# Patient Record
Sex: Male | Born: 1995 | Race: Black or African American | Hispanic: No | Marital: Single | Smoking: Never smoker
Health system: Southern US, Community
[De-identification: ages and names within clinical notes are randomized; demographics above are authoritative.]

## PROBLEM LIST (undated history)

## (undated) DIAGNOSIS — G4730 Sleep apnea, unspecified: Secondary | ICD-10-CM

---
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
## 2017-12-06 ENCOUNTER — Emergency Department (HOSPITAL_BASED_OUTPATIENT_CLINIC_OR_DEPARTMENT_OTHER): Payer: 59

## 2017-12-06 ENCOUNTER — Encounter (HOSPITAL_COMMUNITY): Payer: Self-pay | Admitting: Emergency Medicine

## 2017-12-06 ENCOUNTER — Observation Stay (HOSPITAL_COMMUNITY)
Admission: EM | Admit: 2017-12-06 | Discharge: 2017-12-07 | Disposition: A | Discharge status: Home / Self Care | Payer: 59 | Attending: Student in an Organized Health Care Education/Training Program | Admitting: Student in an Organized Health Care Education/Training Program

## 2017-12-06 ENCOUNTER — Emergency Department (HOSPITAL_COMMUNITY): Payer: 59

## 2017-12-06 ENCOUNTER — Other Ambulatory Visit: Payer: Self-pay

## 2017-12-06 DIAGNOSIS — E669 Obesity, unspecified: Secondary | ICD-10-CM | POA: Diagnosis present

## 2017-12-06 DIAGNOSIS — L03116 Cellulitis of left lower limb: Principal | ICD-10-CM | POA: Insufficient documentation

## 2017-12-06 DIAGNOSIS — Z6839 Body mass index (BMI) 39.0-39.9, adult: Secondary | ICD-10-CM | POA: Diagnosis not present

## 2017-12-06 DIAGNOSIS — M7989 Other specified soft tissue disorders: Secondary | ICD-10-CM

## 2017-12-06 DIAGNOSIS — D72829 Elevated white blood cell count, unspecified: Secondary | ICD-10-CM

## 2017-12-06 DIAGNOSIS — L02426 Furuncle of left lower limb: Secondary | ICD-10-CM

## 2017-12-06 DIAGNOSIS — G4733 Obstructive sleep apnea (adult) (pediatric): Secondary | ICD-10-CM | POA: Diagnosis not present

## 2017-12-06 DIAGNOSIS — Z9989 Dependence on other enabling machines and devices: Secondary | ICD-10-CM

## 2017-12-06 DIAGNOSIS — R509 Fever, unspecified: Secondary | ICD-10-CM

## 2017-12-06 DIAGNOSIS — M79609 Pain in unspecified limb: Secondary | ICD-10-CM

## 2017-12-06 DIAGNOSIS — B351 Tinea unguium: Secondary | ICD-10-CM | POA: Diagnosis not present

## 2017-12-06 HISTORY — DX: Sleep apnea, unspecified: G47.30

## 2017-12-06 LAB — CBC WITH DIFFERENTIAL/PLATELET
BASOS ABS: 0 10*3/uL (ref 0.0–0.1)
Basophils Relative: 0 %
EOS ABS: 0 10*3/uL (ref 0.0–0.7)
Eosinophils Relative: 0 %
HEMATOCRIT: 44.7 % (ref 39.0–52.0)
Hemoglobin: 14.9 g/dL (ref 13.0–17.0)
LYMPHS ABS: 1.1 10*3/uL (ref 0.7–4.0)
Lymphocytes Relative: 4 %
MCH: 29.6 pg (ref 26.0–34.0)
MCHC: 33.3 g/dL (ref 30.0–36.0)
MCV: 88.7 fL (ref 78.0–100.0)
Monocytes Absolute: 0.8 10*3/uL (ref 0.1–1.0)
Monocytes Relative: 3 %
NEUTROS ABS: 24.5 10*3/uL — AB (ref 1.7–7.7)
Neutrophils Relative %: 93 %
Platelets: 271 10*3/uL (ref 150–400)
RBC: 5.04 MIL/uL (ref 4.22–5.81)
RDW: 13.2 % (ref 11.5–15.5)
WBC: 26.4 10*3/uL — ABNORMAL HIGH (ref 4.0–10.5)

## 2017-12-06 LAB — URINALYSIS, ROUTINE W REFLEX MICROSCOPIC
BACTERIA UA: NONE SEEN
Bilirubin Urine: NEGATIVE
Glucose, UA: NEGATIVE mg/dL
HGB URINE DIPSTICK: NEGATIVE
Ketones, ur: NEGATIVE mg/dL
Leukocytes, UA: NEGATIVE
Nitrite: NEGATIVE
Protein, ur: 30 mg/dL — AB
SPECIFIC GRAVITY, URINE: 1.028 (ref 1.005–1.030)
pH: 8 (ref 5.0–8.0)

## 2017-12-06 LAB — COMPREHENSIVE METABOLIC PANEL
ALK PHOS: 108 U/L (ref 38–126)
ALT: 32 U/L (ref 0–44)
AST: 25 U/L (ref 15–41)
Albumin: 4.1 g/dL (ref 3.5–5.0)
Anion gap: 12 (ref 5–15)
BILIRUBIN TOTAL: 1.1 mg/dL (ref 0.3–1.2)
BUN: 15 mg/dL (ref 6–20)
CALCIUM: 9.6 mg/dL (ref 8.9–10.3)
CO2: 23 mmol/L (ref 22–32)
Chloride: 101 mmol/L (ref 98–111)
Creatinine, Ser: 1.16 mg/dL (ref 0.61–1.24)
GFR calc Af Amer: >60 mL/min (ref >60)
GFR calc non Af Amer: >60 mL/min (ref >60)
Glucose, Bld: 116 mg/dL — ABNORMAL HIGH (ref 70–99)
Potassium: 3.9 mmol/L (ref 3.5–5.1)
Sodium: 136 mmol/L (ref 135–145)
TOTAL PROTEIN: 8.1 g/dL (ref 6.5–8.1)

## 2017-12-06 LAB — I-STAT CG4 LACTIC ACID, ED
LACTIC ACID, VENOUS: 1.7 mmol/L (ref 0.5–1.9)
Lactic Acid, Venous: 1.91 mmol/L — ABNORMAL HIGH (ref 0.5–1.9)

## 2017-12-06 LAB — C-REACTIVE PROTEIN: CRP: 16.1 mg/dL — ABNORMAL HIGH (ref <1.0)

## 2017-12-06 LAB — SEDIMENTATION RATE: Sed Rate: 47 mm/hr — ABNORMAL HIGH (ref 0–16)

## 2017-12-06 MED ORDER — POLYETHYLENE GLYCOL 3350 17 G PO PACK: 17.0000 g | PACK | Freq: Every day | ORAL | Status: DC | PRN

## 2017-12-06 MED ORDER — ACETAMINOPHEN 650 MG RE SUPP: 650.0000 mg | Freq: Four times a day (QID) | RECTAL | Status: DC | PRN

## 2017-12-06 MED ORDER — PIPERACILLIN-TAZOBACTAM 3.375 G IVPB 30 MIN
3.3750 g | Freq: Once | INTRAVENOUS | Status: AC
  Administered 2017-12-06: 3.375 g via INTRAVENOUS
  Filled 2017-12-06: qty 50

## 2017-12-06 MED ORDER — KETOROLAC TROMETHAMINE 30 MG/ML IJ SOLN: 30.0000 mg | Freq: Four times a day (QID) | INTRAMUSCULAR | Status: DC | PRN

## 2017-12-06 MED ORDER — ACETAMINOPHEN 325 MG PO TABS
650.0000 mg | ORAL_TABLET | Freq: Once | ORAL | Status: AC | PRN
  Administered 2017-12-06: 650 mg via ORAL
  Filled 2017-12-06: qty 2

## 2017-12-06 MED ORDER — PIPERACILLIN-TAZOBACTAM 3.375 G IVPB: 3.3750 g | Freq: Three times a day (TID) | INTRAVENOUS | Status: DC

## 2017-12-06 MED ORDER — TETANUS-DIPHTH-ACELL PERTUSSIS 5-2.5-18.5 LF-MCG/0.5 IM SUSP: 0.5000 mL | Freq: Once | INTRAMUSCULAR | Status: DC

## 2017-12-06 MED ORDER — ACETAMINOPHEN 325 MG PO TABS: 650.0000 mg | ORAL_TABLET | Freq: Four times a day (QID) | ORAL | Status: DC | PRN

## 2017-12-06 MED ORDER — SODIUM CHLORIDE 0.9 % IV BOLUS
1000.0000 mL | Freq: Once | INTRAVENOUS | Status: AC
  Administered 2017-12-06: 1000 mL via INTRAVENOUS

## 2017-12-06 MED ORDER — VANCOMYCIN HCL IN DEXTROSE 1-5 GM/200ML-% IV SOLN
1000.0000 mg | Freq: Once | INTRAVENOUS | Status: AC
  Administered 2017-12-06: 1000 mg via INTRAVENOUS
  Filled 2017-12-06: qty 200

## 2017-12-06 MED ORDER — ENOXAPARIN SODIUM 40 MG/0.4ML ~~LOC~~ SOLN
40.0000 mg | SUBCUTANEOUS | Status: DC
  Administered 2017-12-06: 40 mg via SUBCUTANEOUS
  Filled 2017-12-06: qty 0.4

## 2017-12-06 MED ORDER — SODIUM CHLORIDE 0.9 % IV SOLN
2.0000 g | INTRAVENOUS | Status: DC
  Administered 2017-12-07: 2 g via INTRAVENOUS
  Filled 2017-12-06 (×2): qty 20

## 2017-12-06 NOTE — ED Provider Notes (Signed)
[facility] EMERGENCY DEPARTMENT Provider Note   CSN: [id number] Arrival date & time: 12/06/17  1342     History   Chief Complaint Chief Complaint  Patient presents with  . Leg Pain  . Leg Swelling    HPI Aaron Welch is a 22 y.o. male.  HPI  Patient is a 22 year old male with a history of sleep apnea and obesity presenting for left lower extremity swelling and erythema.  Patient reports that he has had some swelling of the left lower extremity for "months", however today he had rapid progression of the erythema and pain of the left lower extremity.  He denies any puncture wound to the left lower extremity.  Patient denies any recent travel, prolonged immobilization, or recent surgery or hospitalization.  Patient reports he also became febrile this morning, but did not take his temperature at home.  Patient denies any chest pain, shortness of breath, cough, abdominal pain, testicular pain, dysuria, urgency or frequency.  Patient does report he vomited once this morning, but no longer feels nauseous.  Tdap unknown. No history of immune compromised status.   Past Medical History:  Diagnosis Date  . Sleep apnea     There are no active problems to display for this patient.   History reviewed. No pertinent surgical history.      Home Medications    Prior to Admission medications   Not on File    Family History No family history on file.  Social History Social History   Tobacco Use  . Smoking status: Never Smoker  . Smokeless tobacco: Never Used  Substance Use Topics  . Alcohol use: Yes    Comment: occ  . Drug use: Never     Allergies   Patient has no known allergies.   Review of Systems Review of Systems  Constitutional: Negative for chills and fever.  HENT: Negative for congestion and sore throat.   Eyes: Negative for visual disturbance.  Respiratory: Negative for cough, chest tightness and shortness of breath.   Cardiovascular:  Positive for leg swelling. Negative for chest pain and palpitations.  Gastrointestinal: Positive for nausea and vomiting. Negative for abdominal pain and diarrhea.  Genitourinary: Negative for dysuria and flank pain.  Musculoskeletal: Positive for myalgias. Negative for back pain.  Skin: Positive for color change. Negative for rash and wound.  Neurological: Negative for dizziness, syncope, light-headedness and headaches.     Physical Exam Updated Vital Signs BP 120/74 (BP Location: Right Arm)   Pulse (!) 129   Temp (!) 101.1 F (38.4 C) (Oral)   Resp 16   Ht 6' (1.829 m)   Wt 131.5 kg   SpO2 99%   BMI 39.33 kg/m   Physical Exam  Constitutional: He appears well-developed and well-nourished. No distress.  HENT:  Head: Normocephalic and atraumatic.  Mouth/Throat: Oropharynx is clear and moist.  Eyes: Pupils are equal, round, and reactive to light. Conjunctivae and EOM are normal.  Neck: Normal range of motion. Neck supple.  Cardiovascular: Normal rate, regular rhythm, S1 normal and S2 normal.  No murmur heard. No tachycardia on my exam.   Pulmonary/Chest: Effort normal and breath sounds normal. He has no wheezes. He has no rales.  Abdominal: Soft. He exhibits no distension. There is no tenderness. There is no guarding.  Musculoskeletal: Normal range of motion. He exhibits edema. He exhibits no deformity.  Left lower extremity with edema and erythema to the level of the tibial tuberosity.  Extremity is diffusely erythematous.  Calf is tender, but compartments are soft.  Patient has 2+ capillary refill and 2+ DP pulse of the left lower extremity.  Neurological: He is alert.  Cranial nerves grossly intact. Patient moves extremities symmetrically and with good coordination.  Skin: Skin is warm and dry. No rash noted. No erythema.  Psychiatric: He has a normal mood and affect. His behavior is normal. Judgment and thought content normal.  Nursing note and vitals reviewed.    ED  Treatments / Results  Labs (all labs ordered are listed, but only abnormal results are displayed) Labs Reviewed  COMPREHENSIVE METABOLIC PANEL - Abnormal; Notable for the following components:      Result Value   Glucose, Bld 116 (*)    All other components within normal limits  CBC WITH DIFFERENTIAL/PLATELET - Abnormal; Notable for the following components:   WBC 26.4 (*)    Neutro Abs 24.5 (*)    All other components within normal limits  URINALYSIS, ROUTINE W REFLEX MICROSCOPIC - Abnormal; Notable for the following components:   Protein, ur 30 (*)    All other components within normal limits  I-STAT CG4 LACTIC ACID, ED - Abnormal; Notable for the following components:   Lactic Acid, Venous 1.91 (*)    All other components within normal limits  CULTURE, BLOOD (ROUTINE X 2)  CULTURE, BLOOD (ROUTINE X 2)  I-STAT CG4 LACTIC ACID, ED    EKG None  Radiology Dg Tibia/fibula Left  Result Date: 12/06/2017 CLINICAL DATA:  Swelling in the left lower leg with redness EXAM: LEFT TIBIA AND FIBULA - 2 VIEW COMPARISON:  None. FINDINGS: No fracture or malalignment. No periostitis or bone destruction. Soft tissue edema without soft tissue emphysema or radiopaque foreign body IMPRESSION: 1. No acute osseous abnormality 2. Edema within the soft tissues Electronically Signed   By: Jasmine Pang M.D.   On: 12/06/2017 17:32   Dg Foot Complete Left  Result Date: 12/06/2017 CLINICAL DATA:  Soft tissue infection EXAM: LEFT FOOT - COMPLETE 3+ VIEW COMPARISON:  None. FINDINGS: No fracture or malalignment. Joint spaces are maintained. Edema in the soft tissues. No soft tissue emphysema IMPRESSION: No acute osseous abnormality.  Edema in the soft tissues. Electronically Signed   By: Jasmine Pang M.D.   On: 12/06/2017 17:33    Procedures Procedures (including critical care time)  Medications Ordered in ED Medications  vancomycin (VANCOCIN) IVPB 1000 mg/200 mL premix (has no administration in time  range)  sodium chloride 0.9 % bolus 1,000 mL (has no administration in time range)  acetaminophen (TYLENOL) tablet 650 mg (650 mg Oral Given 12/06/17 1459)     Initial Impression / Assessment and Plan / ED Course  I have reviewed the triage vital signs and the nursing notes.  Pertinent labs & imaging results that were available during my care of the patient were reviewed by me and considered in my medical decision making (see chart for details).  Clinical Course as of Dec 06 1621  Sat Dec 06, 2017  1623 Reassessed pulse at bedside and it is now 25.   [AM]    Clinical Course User Index [AM] Elisha Ponder, PA-C    Patient nontoxic-appearing, but febrile to 101.1.  Clinically, patient has rapid progression of cellulitis of the left lower extremity.  Vascular ultrasound is negative for DVT.  X-rays of the left lower extremity demonstrate no gas in the soft tissues.  Laboratory analysis otherwise remarkable for leukocytosis of 26.4 with left shift, no hyperglycemia, and normal  lactic acid.  Tachycardia resolved in emergency department course, patient had otherwise unremarkable exam for any other infectious etiology.  Patient admitted to internal medicine teaching service.  I appreciate their involvement in the care of this patient.  This is a shared visit with Dr. Margarita Grizzle. Patient was independently evaluated by this attending physician. Attending physician consulted in evaluation and admission management.   Final Clinical Impressions(s) / ED Diagnoses   Final diagnoses:  Cellulitis of left lower extremity  Leukocytosis, unspecified type  Fever in adult    ED Discharge Orders    None       Delia Chimes 12/06/17 1841    Margarita Grizzle, MD 12/07/17 0004

## 2017-12-06 NOTE — ED Notes (Signed)
Pt aware of need for urine sample, given urine cup

## 2017-12-06 NOTE — ED Notes (Signed)
Elevated pt's left lower extremity.

## 2017-12-06 NOTE — ED Notes (Signed)
Pt transported to vascular.  °

## 2017-12-06 NOTE — Progress Notes (Signed)
*  Preliminary Results* Left lower extremity venous duplex completed. Left lower extremity is negative for deep vein thrombosis. There is no evidence of left Baker's cyst.  Incidental finding: there is a heterogenous area of the left groin suggestive of possible prominent inguinal lymph node.  12/06/2017 4:34 PM  Gertie Fey, MHA, RVT, RDCS, RDMS

## 2017-12-06 NOTE — H&P (Signed)
Date: 12/06/2017               Patient Name:  Aaron Welch MRN: [id number]  DOB: [birth year] Age / Sex: 22 y.o., male   PCP: Patient, No Pcp Per         Medical Service: Internal Medicine Teaching Service         Attending Physician: Dr. Evette Doffing, Mallie Mussel, *    First Contact: Dr. Annie Paras Pager: [phone]  Second Contact: Dr. Berline Lopes Pager: [phone]       After Hours (After 5p/  First Contact Pager: [phone]  weekends / holidays): Second Contact Pager: [phone]   Chief Complaint: Left lower extremity swelling   History of Present Illness: Aaron Welch is a 22 yo obese male with OSA who presents with acute on chronic left LE edema with one day of rapidly progressive erythema and worsening of the edema. He states that he's had intermittent swelling of the left leg for the past several years. He played football in highschool but denies any overt trauma to the leg. He was in his usual state of health until this morning when he woke up with acute swelling, redness, and pain of the left lower leg. He also endorsed subjective fever at home and had one episode of non-bloody vomiting. He went to an urgent care and was then sent to the emergency department. He states that his pain dramatically improved after receiving IVFs.   At the time of my exam, he denied any joint or leg pain, n/v/d, abdominal pain, chest pain, sob. Denies any recent trauma or injury of the left leg. Denies polydipsia, polyuria, or unintentional weight loss.   He does not have a PCP.    Meds:  none  Allergies: Allergies as of 12/06/2017  . (No Known Allergies)   Past Medical History:  Diagnosis Date  . Sleep apnea    Family History: HTN, DM, blood clots  Social History: Lives at home with mother and mother's boyfriend. Studies Runner, broadcasting/film/video at [employer]. 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 Denies tobacco  or illegal drug use. Drinks alcohol occasionally.   Review of Systems: A complete ROS was negative except as per HPI.   Physical Exam: Blood pressure 138/75, pulse 97, temperature 98.6 F (37 C), temperature source Oral, resp. rate 18, height 6' (1.829 m), weight 131.5 kg, SpO2 100 %. Vitals:   12/06/17 1730 12/06/17 1745 12/06/17 1925 12/06/17 1959  BP: 111/62  125/63 138/75  Pulse: 94 94 97 97  Resp:    18  Temp:    98.6 F (37 C)  TempSrc:    Oral  SpO2: 97% 100% 99% 100%  Weight:      Height:       General: Vital signs reviewed.  Patient is well-developed and well-nourished, in no acute distress and cooperative with exam.  Head: Normocephalic and atraumatic. Eyes: EOMI, conjunctivae normal, no scleral icterus.  Neck: Supple, trachea midline, normal ROM.  Cardiovascular: RRR, S1 normal, S2 normal, extra heart sound heard at pulmonic position.  Pulmonary/Chest: Clear to auscultation bilaterally, no wheezes, rales, or rhonchi. Abdominal: Soft, non-tender, non-distended, BS +, or guarding present.  Musculoskeletal: left ankle red and swollen but full ROM Extremities: Left lower extremity red, warm, and swollen from foot to calf. Trace pitting edema over tibia and significant non-pitting edema over ankle. No tenderness to palpation of any part of the  left lower extremity. No wounds or excoriations. No fluctuance or purulence. No erythematous streaking up the thigh. Area of erythema was demarcated with skin marker. DP pulse is strong and symmetric bilaterally. Sensation is normal and symmetric. Right lower extremity is normal.  Neurological: A&O x3, Strength is normal and symmetric bilaterally, cranial nerve II-XII are grossly intact, no focal motor deficit, sensory intact to light touch bilaterally.  Skin: Warm, dry and intact. Psychiatric: Normal mood and affect. speech and behavior is normal. Cognition and memory are normal.   Labs: CBC demonstrated a leukocytosis of 26.4K, stable  hemoglobin  Mild lactic acidosis of 1.91 Blood cultures drawn  Imaging:  DG of the foot and Tib/fib failed to reveal an osseous abnormality Vascular US of the left lower extremity negative for acute DVT but did show a questionably enlarged inguinal lymph node.   Assessment & Plan by Problem: Active Problems:   Cellulitis of left lower extremity   OSA (obstructive sleep apnea)   Obesity  Assessment and Plan: Kyshawn Teal is a 22 yo obese M with OSA who presents with several months of left LE edema with rapidly progressive erythema and worsening edema, found to have fever and leukocytosis and exam concerning for cellulitis.   Moderate, Non purulent Cellulitis: One day history of left LE edema and erythema with fever to 101.59F and leukocytosis of 26.4. Significant swelling, warmth, and redness on exam but without purulence. No wounds or recent trauma. No history of diabetes or prior cellulitis.  - given Tdap, 1L NS bolus, and one dose of IV Vanc and Zosyn in the ED - ordered Ceftriaxone to start in the morning - Tylenol and Toradol prn for pain  - CRP, ESR, HIV - repeat CBC in the morning  - f/u BCx  OSA: wears CPAP at home with sleep  - CPAP  Obesity: States that he has gained several pounds recently and doesn't have the best diet. He works out but states that he gains back the weight very quickly.  - education on diet and exercise routines that are sustainable for him  DVT ppx: Lovenox Diet: regular  IVFs: none    Dispo: Admit patient to Inpatient with expected length of stay greater than 2 midnights.  Signed: Isabelle Course, MD 12/06/2017, 9:34 PM  Pager: [phone]

## 2017-12-06 NOTE — Progress Notes (Signed)
Pharmacy Antibiotic Note  Aaron Welch is a 22 y.o. male admitted on 12/06/2017 with LLE swelling and erythema concerning for cellulitis along with systemic symptoms including WBC 26.4, temp 101.28m, LA 1.9.  Vancomycin x 1 ordered to be given in the ED and pharmacy now consulted for Zosyn dosing.   Plan: - Zosyn 3.375g IV x 1 now followed by 3.375g IV every 8 hours (infused over 4 hours) - Will follow-up on additional orders for Vancomycin - Will continue to follow renal function, culture results, LOT, and antibiotic de-escalation plans   Height: 6' (182.9 cm) Weight: 290 lb (131.5 kg) IBW/kg (Calculated) : 77.6  Temp (24hrs), Avg:101.1 F (38.4 C), Min:101.1 F (38.4 C), Max:101.1 F (38.4 C)  Recent Labs  Lab 12/06/17 1508 12/06/17 1517  WBC 26.4*  --   CREATININE 1.16  --   LATICACIDVEN  --  1.91*    Estimated Creatinine Clearance: 139 mL/min (by C-G formula based on SCr of 1.16 mg/dL).    No Known Allergies  Antimicrobials this admission: Vanc 9/28 >> Zosyn 9/28 >>  Dose adjustments this admission: n/a  Microbiology results: 9/28 BCx >>  Thank you for allowing pharmacy to be a part of this patient's care.  Georgina Pillion, PharmD, BCPS Clinical Pharmacist Pager: [phone] Please check AMION for all [facility] Pharmacy numbers 12/06/2017 4:51 PM

## 2017-12-07 ENCOUNTER — Encounter (HOSPITAL_COMMUNITY): Payer: Self-pay

## 2017-12-07 DIAGNOSIS — Z6839 Body mass index (BMI) 39.0-39.9, adult: Secondary | ICD-10-CM

## 2017-12-07 DIAGNOSIS — L03116 Cellulitis of left lower limb: Secondary | ICD-10-CM | POA: Diagnosis not present

## 2017-12-07 DIAGNOSIS — E669 Obesity, unspecified: Secondary | ICD-10-CM | POA: Diagnosis not present

## 2017-12-07 DIAGNOSIS — R739 Hyperglycemia, unspecified: Secondary | ICD-10-CM

## 2017-12-07 DIAGNOSIS — G4733 Obstructive sleep apnea (adult) (pediatric): Secondary | ICD-10-CM | POA: Diagnosis not present

## 2017-12-07 LAB — CBC
HCT: 41.3 % (ref 39.0–52.0)
Hemoglobin: 13.5 g/dL (ref 13.0–17.0)
MCH: 29.3 pg (ref 26.0–34.0)
MCHC: 32.7 g/dL (ref 30.0–36.0)
MCV: 89.6 fL (ref 78.0–100.0)
PLATELETS: 248 10*3/uL (ref 150–400)
RBC: 4.61 MIL/uL (ref 4.22–5.81)
RDW: 13.4 % (ref 11.5–15.5)
WBC: 16.2 10*3/uL — AB (ref 4.0–10.5)

## 2017-12-07 LAB — HEMOGLOBIN A1C
Hgb A1c MFr Bld: 5.7 % — ABNORMAL HIGH (ref 4.8–5.6)
Mean Plasma Glucose: 116.89 mg/dL

## 2017-12-07 LAB — HIV ANTIBODY (ROUTINE TESTING W REFLEX): HIV Screen 4th Generation wRfx: NONREACTIVE

## 2017-12-07 MED ORDER — CEPHALEXIN 500 MG PO CAPS: 500.0000 mg | ORAL_CAPSULE | Freq: Four times a day (QID) | ORAL | 0 refills | Status: AC

## 2017-12-07 MED ORDER — ENOXAPARIN SODIUM 40 MG/0.4ML ~~LOC~~ SOLN: 40.0000 mg | SUBCUTANEOUS | Status: DC

## 2017-12-07 NOTE — Progress Notes (Signed)
Aaron Welch to be D/C'd  per MD order. Discussed with the patient and all questions fully answered.  VSS, Skin clean, dry and intact without evidence of skin break down, no evidence of skin tears noted.  IV catheter discontinued intact. Site without signs and symptoms of complications. Dressing and pressure applied.  An After Visit Summary was printed and given to the patient. Patient received prescription.  D/c education completed with patient/family including follow up instructions, medication list, d/c activities limitations if indicated, with other d/c instructions as indicated by MD - patient able to verbalize understanding, all questions fully answered.   Patient instructed to return to ED, call 911, or call MD for any changes in condition.   Patient to be escorted via WC, and D/C home via private auto.

## 2017-12-07 NOTE — Progress Notes (Signed)
   Subjective: The patient was resting in his bed today when entering the room.  He denied pain in his foot.  Patient stated that he has had an 8-year history of left lower extremity swelling that is worse with standing and improved with rest.  The patient cannot recall the extent to which this was evaluated in the past and says that he has not followed up with a primary care provider in many years.  He denies fever, chills, nausea, vomiting, foot pain or leg pain.  Objective:  Vital signs in last 24 hours: Vitals:   12/06/17 1959 12/06/17 2157 12/07/17 0457 12/07/17 0524  BP: 138/75 (!) 143/63 121/74 133/71  Pulse: 97 96 95 84  Resp: _0 Temp: 98.6 F (37 C) 98.6 F (37 C) 97.8 F (36.6 C) 98.6 F (37 C)  TempSrc: Oral Oral Oral Oral  SpO2: 100% 100% 100% 100%  Weight:      Height:       Physical exam: General: Alert and oriented x4, no acute distress, afebrile, nondiaphoretic MSK: Left lower extremity is edematous to just below the patella, this is associated with mild erythema of the same area.  There is pitting of the ankle and foot.  There are small lesions representative of prior follicular infections but otherwise no focal point to the erythema or edema.  Assessment/Plan:  Active Problems:   Cellulitis of left lower extremity   OSA (obstructive sleep apnea)   Obesity  Aaron Welch is a 22 year old male with a past medical history notable for OSA and obesity who presented to the [facility] emergency department for acutely worsening left lower extremity edema and new onset erythema associated with pain.  There is no history of evidence of DVT on evaluation.  The patient denied traumatic event to the area.  The patient was initially febrile on admission which has now resolved.  In addition there was a marked leukocytosis which has improved greatly following initiation of IV antibiotics.  Given his rapid improvement and lack of current systemic symptoms we will likely  discharge patient home today on oral antibiotics pending completion of his evaluation.  Prior to potential discharge, however, we will obtain an hemoglobin A1c to evaluate for possible diabetes which may alter our choice and length of antibiotic/overall therapy. -Discontinue vancomycin and Zosyn -Ceftriaxone 1 dose of IV Q 24-hour dosing overnight -Pain has resolved -CRP/ESR elevated which is consistent with cellulitis, will correlate with A1c and consider MRI if indicated, but currently low suspicion for osteomyelitis  -HIV pending -Blood cultures in process  Dispo: Anticipated discharge in approximately 0 day(s).   Kathi Ludwig, MD 12/07/2017, 10:43 AM Pager: Pager# [phone]

## 2017-12-07 NOTE — Discharge Summary (Signed)
Name: Aaron Welch MRN: [id number] DOB: 01-12-1996 22 y.o. PCP: Patient, No Pcp Per  Date of Admission: 12/06/2017  2:31 PM Date of Discharge: 12/07/2017 Attending Physician: Tyson Alias, *  Discharge Diagnosis: 1. Cellulitis  2. LLE Edema 3. Elevated HgbA1c 5.7%  Discharge Medications: Allergies as of 12/07/2017   No Known Allergies     Medication List    TAKE these medications   cephALEXin 500 MG capsule Commonly known as:  KEFLEX Take 1 capsule (500 mg total) by mouth 4 (four) times daily.       Disposition and follow-up:   [name] was discharged from [facility] in Good condition.  At the hospital follow up visit please address:  1.  Cellulitis: LLE erythema, fever, leukocytosis that responded to parenteral antibiotics with resolution of pain and fever. Discharged to complete a five day course with cephalexin.      Left lower extremity Edema: Chronic, DVT evaluation with ultrasound negative in LLE.       A1c 5.7%. Recommend following annually   2.  Labs / imaging needed at time of follow-up: Evaluation of the LLE edema, CBC   3.  Pending labs/ test needing follow-up: Blood cultures, HIV screen  Follow-up Appointments:   Hospital Course by problem list: 1. Left Lower Extremity Cellulitis and Edema: Aaron Welch is a 22 year old male with a past medical history notable for OSA and obesity who presented to the [facility] emergency department for acutely worsening left lower extremity edema and new onset erythema associated with pain. There was no history consistent with or evidence of DVT on evaluation with venous doppler US negative.  The patient denied traumatic event to the area.  The patient was initially febrile on admission which had without recurrence.  In addition there was a marked leukocytosis which had improved greatly following initiation of IV antibiotics.  Given his rapid improvement and lack of current  systemic symptoms he was discharged on oral antibiotics to complete his treatment course.  Discharge Vitals:   BP 133/71 (BP Location: Left Arm)   Pulse 84   Temp 98.6 F (37 C) (Oral)   Resp 18   Ht 6' (1.829 m)   Wt 131.5 kg   SpO2 100%   BMI 39.33 kg/m   Pertinent Labs, Studies, and Procedures:  CBC Latest Ref Rng & Units 12/07/2017 12/06/2017  WBC 4.0 - 10.5 K/uL 16.2(H) 26.4(H)  Hemoglobin 13.0 - 17.0 g/dL 74.2 59.5  Hematocrit 63.8 - 52.0 % 41.3 44.7  Platelets 150 - 400 K/uL 248 271   CMP Latest Ref Rng & Units 12/06/2017  Glucose 70 - 99 mg/dL 756(E)  BUN 6 - 20 mg/dL 15  Creatinine 3.32 - 9.51 mg/dL 8.84  Sodium 166 - 063 mmol/L 136  Potassium 3.5 - 5.1 mmol/L 3.9  Chloride 98 - 111 mmol/L 101  CO2 22 - 32 mmol/L 23  Calcium 8.9 - 10.3 mg/dL 9.6  Total Protein 6.5 - 8.1 g/dL 8.1  Total Bilirubin 0.3 - 1.2 mg/dL 1.1  Alkaline Phos 38 - 126 U/L 108  AST 15 - 41 U/L 25  ALT 0 - 44 U/L 32    Discharge Instructions: Discharge Instructions    Call MD for:  persistant nausea and vomiting   Complete by:  As directed    Call MD for:  severe uncontrolled pain   Complete by:  As directed    Call MD for:  temperature >100.4   Complete by:  As directed    Diet - low sodium heart healthy   Complete by:  As directed    Discharge instructions   Complete by:  As directed    We recommend that you contact a clinic to establish care with a primary care doctor so that they may follow-up on your discharge and the swelling in your left lower extremity.   We feel that you are stable for discharge home today with four additional days of oral antibiotics to complete a 5 day total course of therapy.   If you were to develop a return of your fever, chills, worsening swelling or redness at the same site or pain, please return to the ER for further treatment.   Increase activity slowly   Complete by:  As directed       Signed: Lanelle Bal, MD 12/07/2017, 12:14 PM   Pager:  Pager# [phone]

## 2017-12-07 NOTE — Discharge Instructions (Signed)
Please discuss the hemoglobin A1c result with your doctor. At 5.7%, you are at increased risk of developing diabetes.

## 2017-12-11 LAB — CULTURE, BLOOD (ROUTINE X 2)
CULTURE: NO GROWTH
Culture: NO GROWTH
Special Requests: ADEQUATE
Special Requests: ADEQUATE

## 2019-11-12 IMAGING — CR DG FOOT COMPLETE 3+V*L*
3 series · 3 of 3 positions shown · non-contrast
Comparison: None.

CLINICAL DATA: Soft tissue infection

EXAM:
LEFT FOOT - COMPLETE 3+ VIEW

[foot ap]
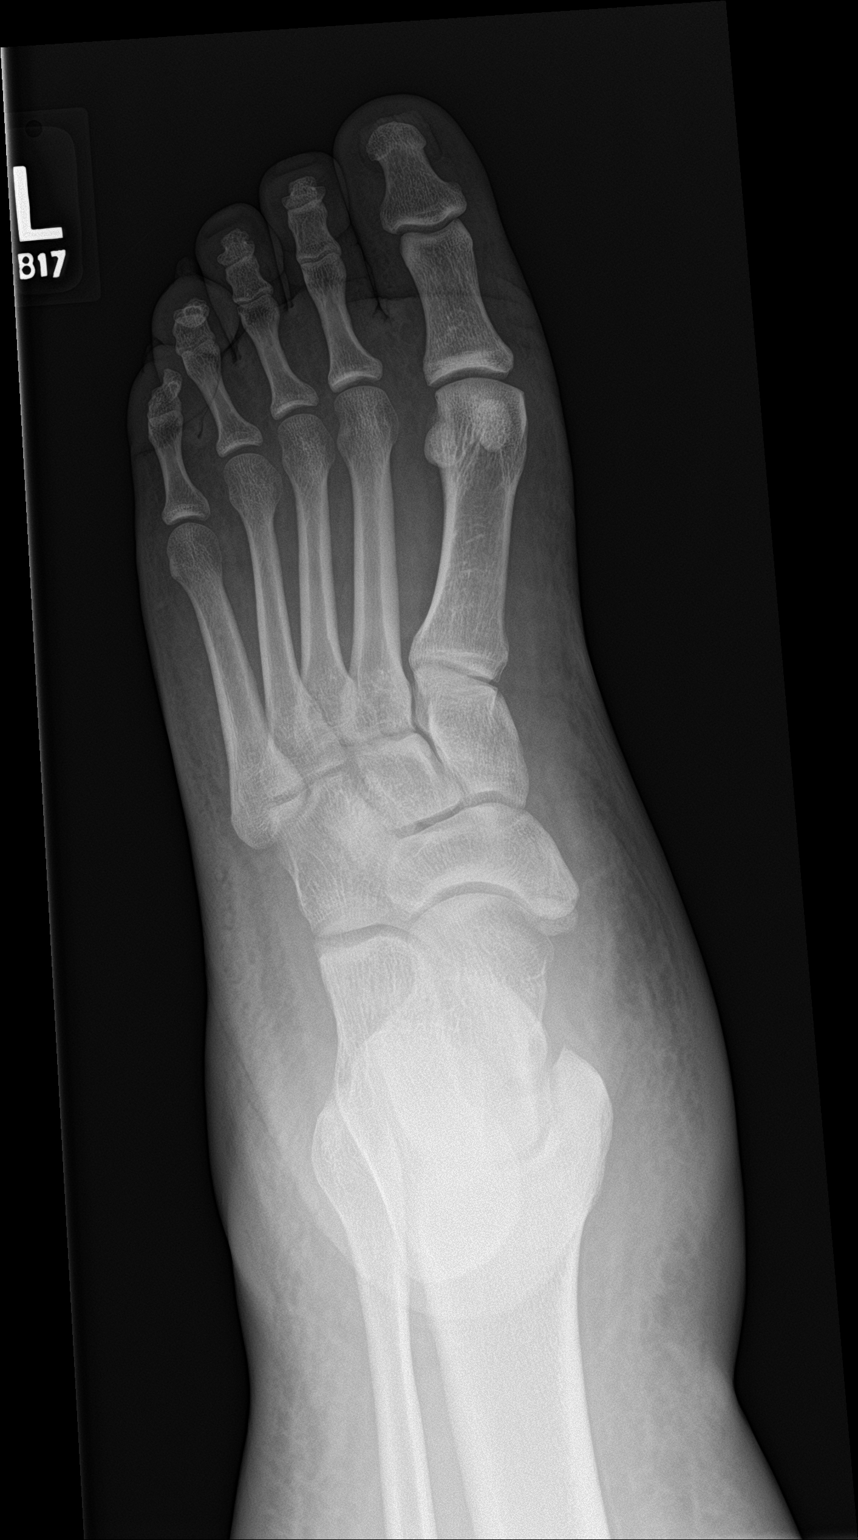

[foot obl]
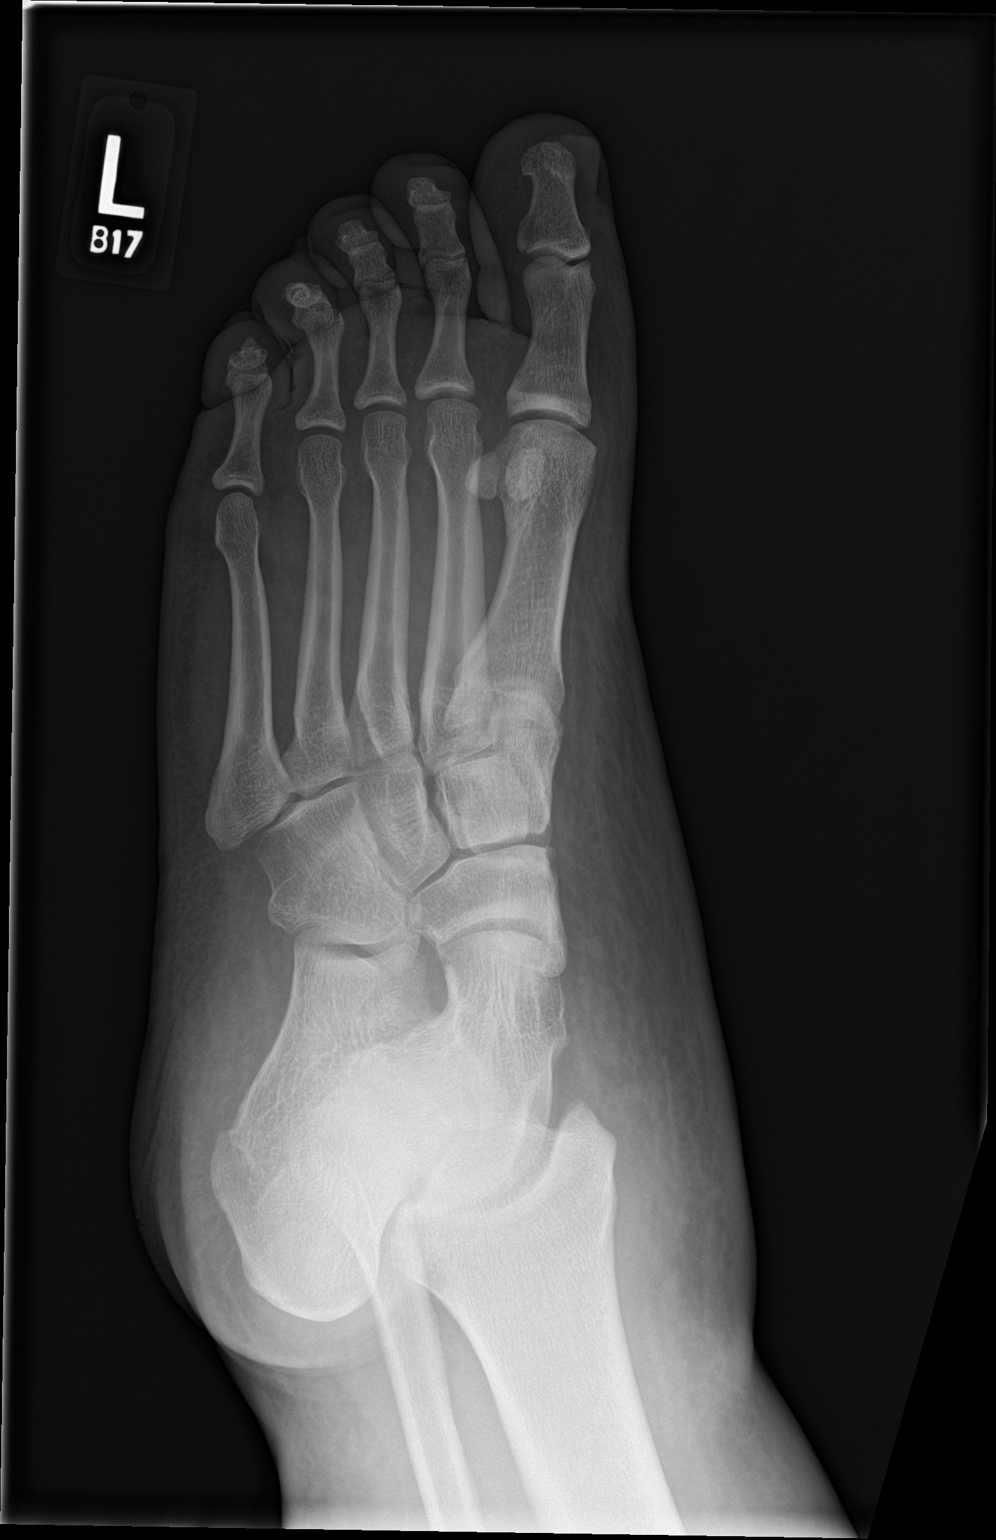

[foot lat]
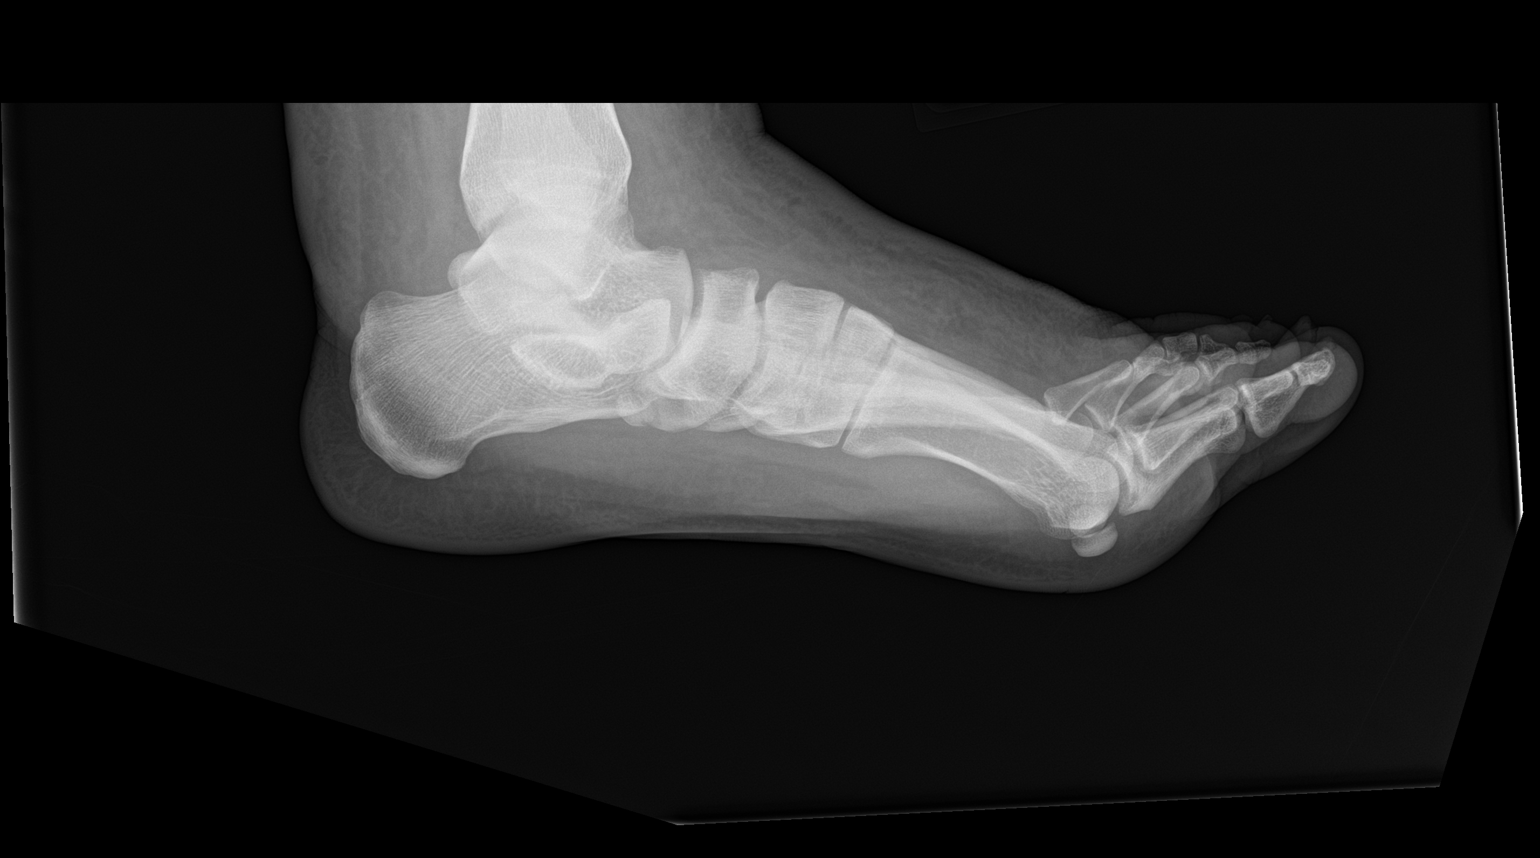

[3 of 3 positions shown; findings below may reference images not displayed]

FINDINGS: No fracture or malalignment. Joint spaces are maintained. Edema in
the soft tissues. No soft tissue emphysema
IMPRESSION: No acute osseous abnormality.  Edema in the soft tissues.

## 2019-11-12 IMAGING — CR DG TIBIA/FIBULA 2V*L*
4 series · 4 of 4 positions shown · non-contrast
Comparison: None.

CLINICAL DATA: Swelling in the left lower leg with redness

EXAM:
LEFT TIBIA AND FIBULA - 2 VIEW

[tibia ap (1 of 2)]
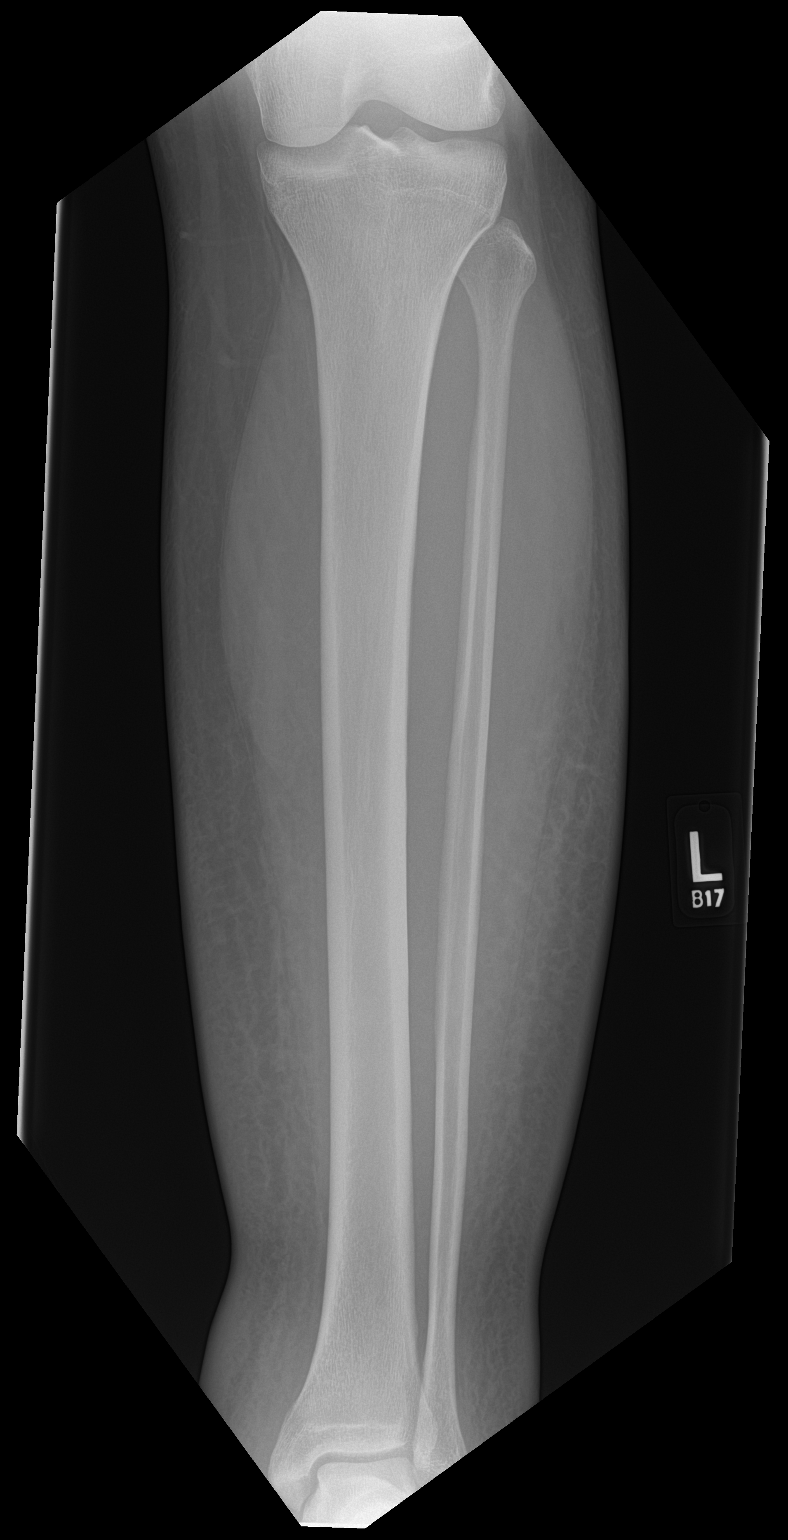

[tibia ap (2 of 2)]
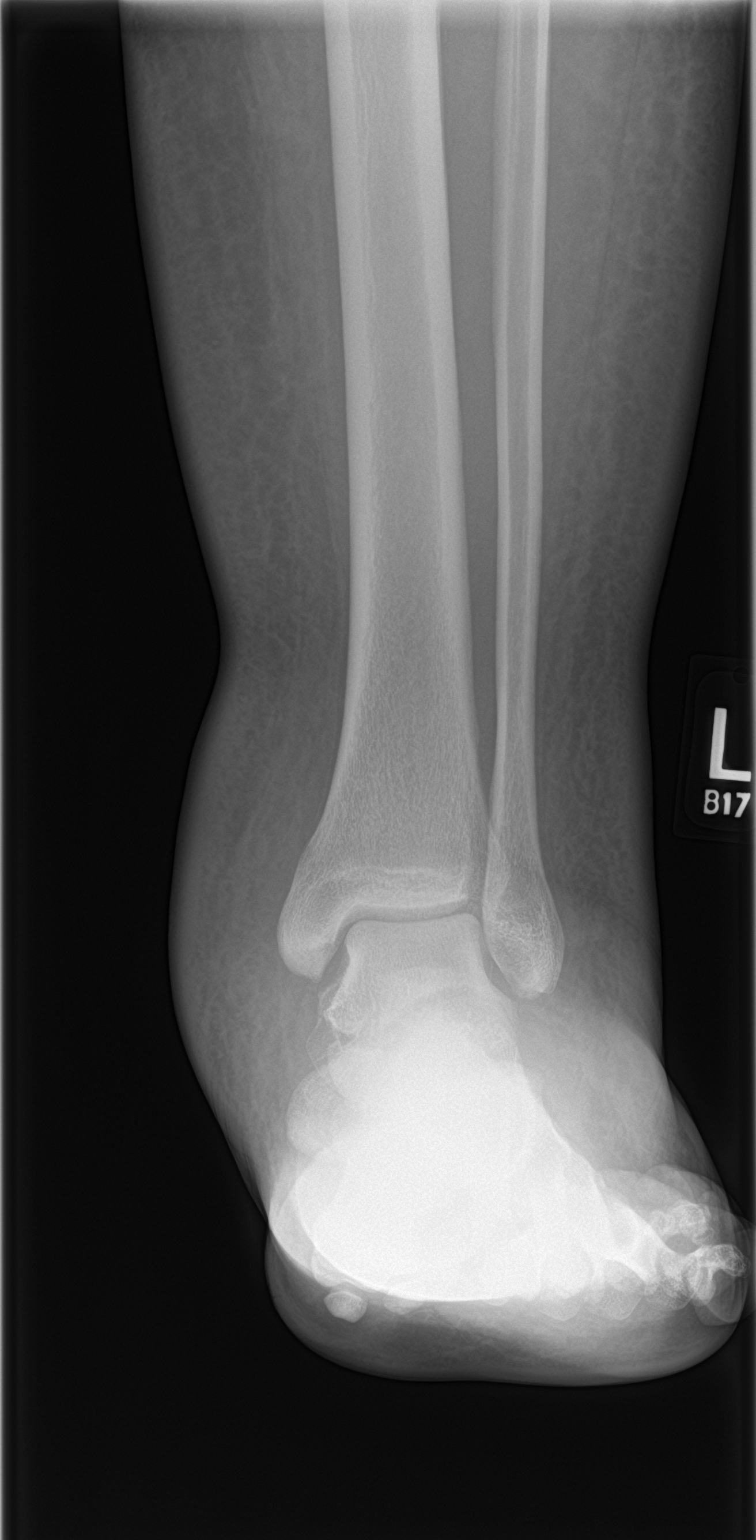

[tibia lat (1 of 2)]
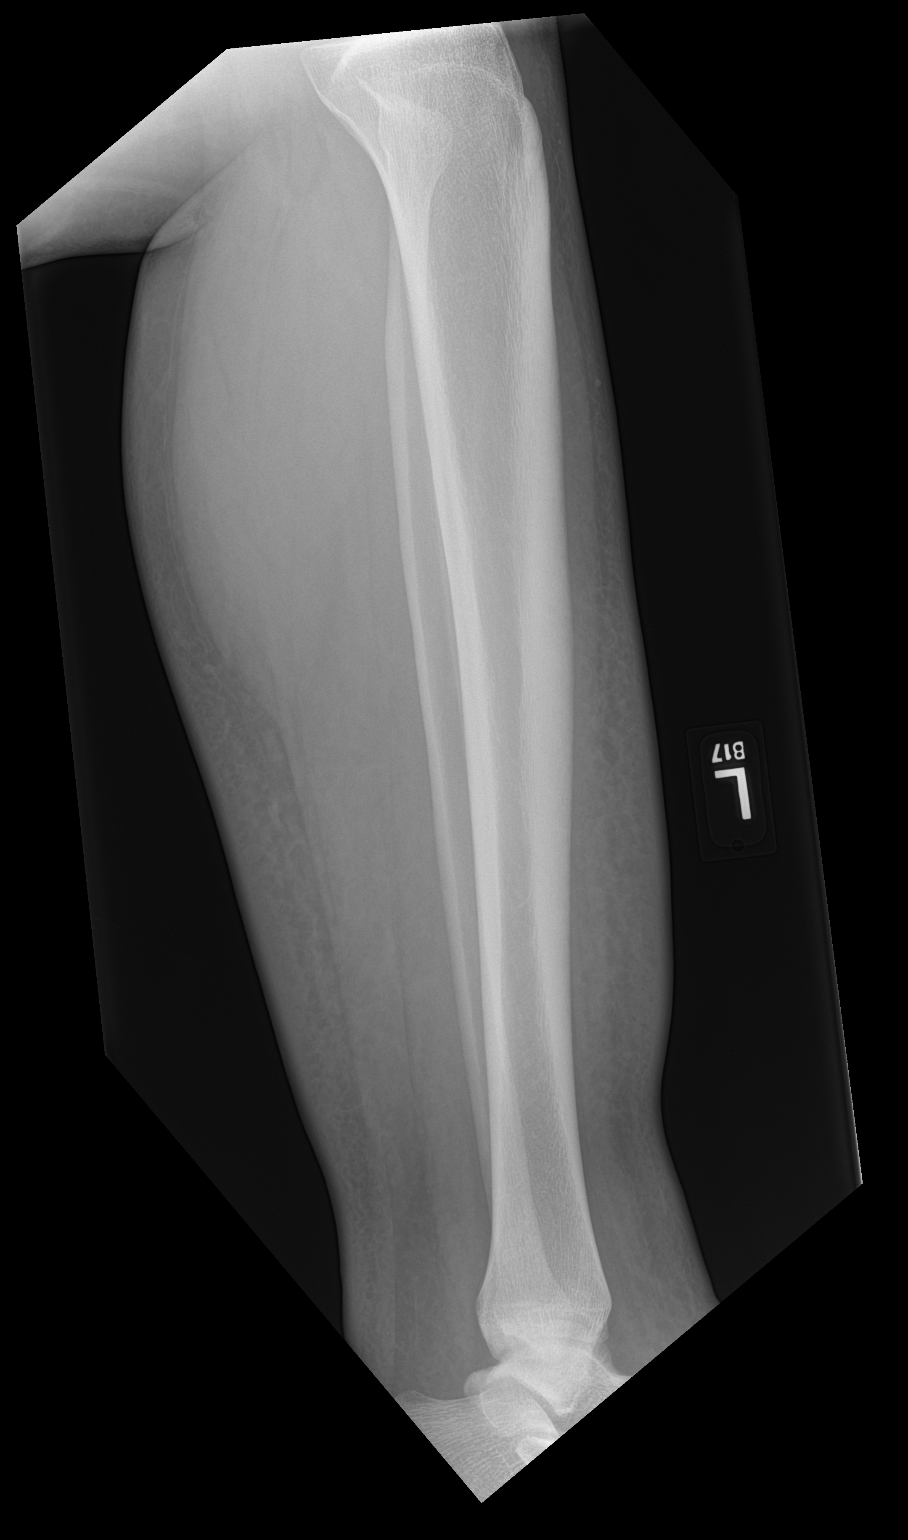

[tibia lat (2 of 2)]
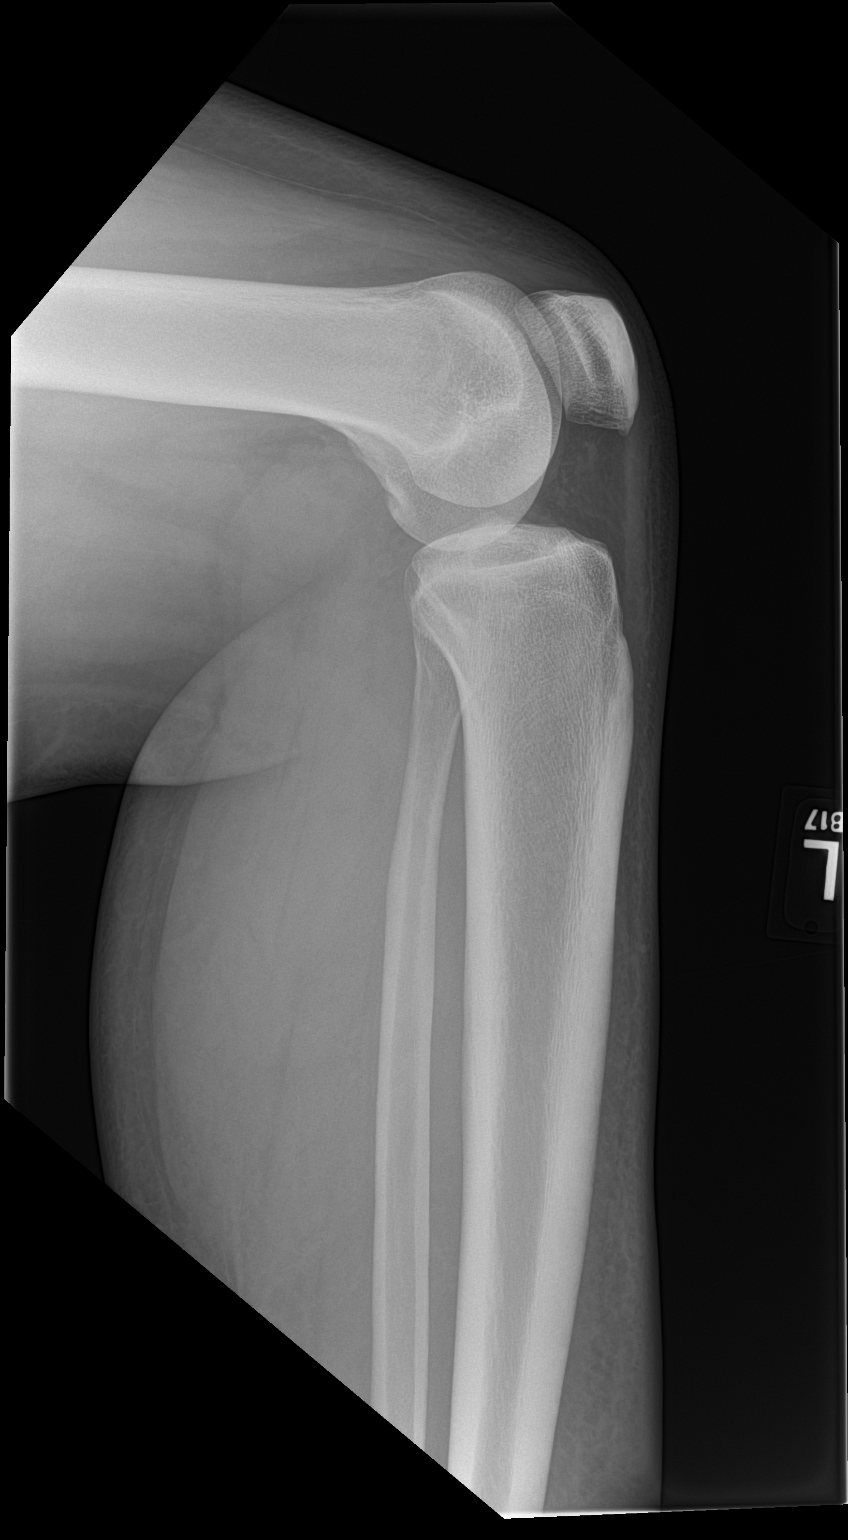

[4 of 4 positions shown; findings below may reference images not displayed]

FINDINGS: No fracture or malalignment. No periostitis or bone destruction.
Soft tissue edema without soft tissue emphysema or radiopaque
foreign body
IMPRESSION: 1. No acute osseous abnormality
2. Edema within the soft tissues
